# Patient Record
Sex: Male | Born: 1995 | Race: White | Hispanic: No | Marital: Single | Smoking: Never smoker
Health system: Southern US, Community
[De-identification: ages and names within clinical notes are randomized; demographics above are authoritative.]

## PROBLEM LIST (undated history)

## (undated) HISTORY — PX: TYMPANOSTOMY TUBE PLACEMENT: SHX32

---
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
## 2019-12-16 ENCOUNTER — Ambulatory Visit: Payer: BLUE CROSS/BLUE SHIELD | Attending: Internal Medicine

## 2019-12-16 DIAGNOSIS — Z20822 Contact with and (suspected) exposure to covid-19: Secondary | ICD-10-CM

## 2019-12-17 LAB — NOVEL CORONAVIRUS, NAA: SARS-CoV-2, NAA: NOT DETECTED

## 2020-01-21 ENCOUNTER — Ambulatory Visit
Admission: EM | Admit: 2020-01-21 | Discharge: 2020-01-21 | Disposition: A | Discharge status: Home / Self Care | Payer: BC Managed Care – PPO | Attending: Family Medicine | Admitting: Family Medicine

## 2020-01-21 ENCOUNTER — Other Ambulatory Visit: Payer: Self-pay

## 2020-01-21 ENCOUNTER — Encounter: Payer: Self-pay | Admitting: Emergency Medicine

## 2020-01-21 DIAGNOSIS — R0781 Pleurodynia: Secondary | ICD-10-CM | POA: Diagnosis not present

## 2020-01-21 MED ORDER — ALBUTEROL SULFATE HFA 108 (90 BASE) MCG/ACT IN AERS: 1.0000 | INHALATION_SPRAY | Freq: Four times a day (QID) | RESPIRATORY_TRACT | 0 refills | Status: DC | PRN

## 2020-01-21 MED ORDER — MELOXICAM 15 MG PO TABS: 15.0000 mg | ORAL_TABLET | Freq: Every day | ORAL | 0 refills | Status: DC | PRN

## 2020-01-21 NOTE — ED Triage Notes (Signed)
Pt c/o lower chest pain and epigastric pain. Started this morning. He states that it feels hard to take a deep breath. Denies sweats nausea or vomiting.

## 2020-01-21 NOTE — ED Provider Notes (Signed)
[facility]    CSN: [id number] Arrival date & time: 01/21/20  5027      History   Chief Complaint Chief Complaint  Patient presents with  . Chest Pain   HPI  24 year old male presents for evaluation of the above.  Patient reports that he developed chest pain this morning.  Localizes the pain to the lower rib cage bilaterally.  Patient states that he feels like his heart to take deep breath.  No nausea or vomiting.  No diaphoresis.  No central chest pain.  He describes it as a stabbing/sharp pain.  He has a current smoker (marijuana).  No medications or interventions tried.  No relieving factors.  No other complaints.  PMH: Recurrent otitis media  Past Surgical History:  Procedure Laterality Date  . TYMPANOSTOMY TUBE PLACEMENT     Home Medications    Prior to Admission medications   Medication Sig Start Date End Date Taking? Authorizing Provider  albuterol (VENTOLIN HFA) 108 (90 Base) MCG/ACT inhaler Inhale 1-2 puffs into the lungs every 6 (six) hours as needed for wheezing or shortness of breath. 01/21/20   Coral Spikes, DO  meloxicam (MOBIC) 15 MG tablet Take 1 tablet (15 mg total) by mouth daily as needed for pain. 01/21/20   Coral Spikes, DO    Family History Family History  Problem Relation Age of Onset  . Healthy Mother   . Healthy Father     Social History Social History   Tobacco Use  . Smoking status: Never Smoker  . Smokeless tobacco: Never Used  Substance Use Topics  . Alcohol use: No  . Drug use: Yes    Types: Marijuana     Allergies   Penicillins  Review of Systems Review of Systems  Constitutional: Negative.   Cardiovascular: Positive for chest pain.   Physical Exam Triage Vital Signs ED Triage Vitals  Enc Vitals Group     BP 01/21/20 0909 124/74     Pulse Rate 01/21/20 0909 71     Resp 01/21/20 0909 18     Temp 01/21/20 0909 98.1 F (36.7 C)     Temp Source 01/21/20 0909 Oral     SpO2 01/21/20 0909 100 %     Weight  01/21/20 0907 190 lb (86.2 kg)     Height 01/21/20 0907 6' (1.829 m)     Head Circumference --      Peak Flow --      Pain Score 01/21/20 0907 7     Pain Loc --      Pain Edu? --      Excl. in [place]? --    Updated Vital Signs BP 124/74 (BP Location: Left Arm)   Pulse 71   Temp 98.1 F (36.7 C) (Oral)   Resp 18   Ht 6' (1.829 m)   Wt 86.2 kg   SpO2 100%   BMI 25.77 kg/m   Visual Acuity Right Eye Distance:   Left Eye Distance:   Bilateral Distance:    Right Eye Near:   Left Eye Near:    Bilateral Near:     Physical Exam Vitals and nursing note reviewed.  Constitutional:      General: He is not in acute distress.    Appearance: He is well-developed and normal weight. He is not ill-appearing.  HENT:     Head: Normocephalic and atraumatic.  Eyes:     General:        Right eye: No discharge.  Left eye: No discharge.     Conjunctiva/sclera: Conjunctivae normal.  Cardiovascular:     Rate and Rhythm: Normal rate and regular rhythm.     Heart sounds: Normal heart sounds. No murmur.  Pulmonary:     Effort: Pulmonary effort is normal. No respiratory distress.     Breath sounds: Normal breath sounds. No wheezing, rhonchi or rales.  Neurological:     Mental Status: He is alert.  Psychiatric:        Mood and Affect: Mood normal.        Behavior: Behavior normal.    UC Treatments / Results  Labs (all labs ordered are listed, but only abnormal results are displayed) Labs Reviewed - No data to display  EKG Interpretation: Normal sinus rhythm with rate of 74.  Left axis deviation.  Incomplete right bundle branch block.  No ST or T wave changes.  Pulmonary disease pattern noted.  Radiology No results found.  Procedures Procedures (including critical care time)  Medications Ordered in UC Medications - No data to display  Initial Impression / Assessment and Plan / UC Course  I have reviewed the triage vital signs and the nursing notes.  Pertinent labs & imaging  results that were available during my care of the patient were reviewed by me and considered in my medical decision making (see chart for details).    24 year old male presents with pleuritic pain.  Patient reporting lower rib pain bilaterally when he takes a deep breath.  Patient states that it feels like it is hard to take a deep breath.  Exam is unrevealing.  No evidence of cardiac ischemia on EKG.  I suspect that the patient has pleuritic pain.  Advised to quit smoking.  Albuterol as prescribed.  Meloxicam as needed for pain.  Work note given.  Supportive care.  Final Clinical Impressions(s) / UC Diagnoses   Final diagnoses:  Pleuritic pain     Discharge Instructions     Rest.  Medication as prescribed.  Heat.  No smoking.  Take care  Dr. Adriana Simas    ED Prescriptions    Medication Sig Dispense Auth. Provider   albuterol (VENTOLIN HFA) 108 (90 Base) MCG/ACT inhaler Inhale 1-2 puffs into the lungs every 6 (six) hours as needed for wheezing or shortness of breath. 18 g Eivin Mascio G, DO   meloxicam (MOBIC) 15 MG tablet Take 1 tablet (15 mg total) by mouth daily as needed for pain. 30 tablet Tommie Sams, DO     PDMP not reviewed this encounter.   Everlene Other [place], [place] 01/21/20 [phone]

## 2020-01-21 NOTE — Discharge Instructions (Signed)
Rest.  Medication as prescribed.  Heat.  No smoking.  Take care  Dr. Adriana Simas

## 2020-02-06 ENCOUNTER — Other Ambulatory Visit: Payer: Self-pay | Admitting: Family Medicine

## 2020-02-13 ENCOUNTER — Other Ambulatory Visit: Payer: Self-pay | Admitting: Family Medicine

## 2022-06-15 ENCOUNTER — Ambulatory Visit
Admission: EM | Admit: 2022-06-15 | Discharge: 2022-06-15 | Disposition: A | Discharge status: Home / Self Care | Payer: BC Managed Care – PPO | Attending: Emergency Medicine | Admitting: Emergency Medicine

## 2022-06-15 DIAGNOSIS — H00011 Hordeolum externum right upper eyelid: Secondary | ICD-10-CM

## 2022-06-15 MED ORDER — POLYMYXIN B-TRIMETHOPRIM 10000-0.1 UNIT/ML-% OP SOLN: 1.0000 [drp] | OPHTHALMIC | 0 refills | Status: DC

## 2022-06-15 NOTE — Discharge Instructions (Addendum)
Your eye appears to have a stye, these typically resolve within 2 to 3 weeks, inside your packet is more information about this very common condition  We will provide bacterial coverage, place 1 drop of Polytrim into your right eye every 4 hours for the next 7 days  You may hold warm compresses over the affected eye for general comfort and to help with swelling  You may continue use of Visine to help with eye dryness  You may stop use of Benadryl as it will not help  If your symptoms persist past 2 to 3 weeks, you begin to have difficulty seeing, or your stye enlarges you may follow-up with the urgent care or with the ophthalmologist (eye specialist) information is on your paperwork

## 2022-06-15 NOTE — ED Provider Notes (Signed)
[facility]    CSN: [id number] Arrival date & time: 06/15/22  0913      History   Chief Complaint Chief Complaint  Patient presents with   Stye    HPI Ian Butler is a 26 y.o. male.   Patient presents with stye to the right upper eyelid beginning 3 days ago.  Stye is causing dryness and irritation to the eye.  Has attempted use of Benadryl and over-the-counter Visine eyedrops which has been minimally helpful.  Does not wear contacts or glasses.  Denies eye pain, drainage, blurred vision or light sensitivity.    History reviewed. No pertinent past medical history.  There are no problems to display for this patient.   Past Surgical History:  Procedure Laterality Date   TYMPANOSTOMY TUBE PLACEMENT         Home Medications    Prior to Admission medications   Medication Sig Start Date End Date Taking? Authorizing Provider  albuterol (VENTOLIN HFA) 108 (90 Base) MCG/ACT inhaler Inhale 1-2 puffs into the lungs every 6 (six) hours as needed for wheezing or shortness of breath. 01/21/20  Yes Cook, Jayce G, DO  meloxicam (MOBIC) 15 MG tablet Take 1 tablet (15 mg total) by mouth daily as needed for pain. 01/21/20  Yes Tommie Sams, DO    Family History Family History  Problem Relation Age of Onset   Healthy Mother    Healthy Father     Social History Social History   Tobacco Use   Smoking status: Never   Smokeless tobacco: Never  Vaping Use   Vaping Use: Never used  Substance Use Topics   Alcohol use: No   Drug use: Not Currently    Types: Marijuana     Allergies   Penicillins   Review of Systems Review of Systems  Constitutional: Negative.   Eyes:  Negative for photophobia, pain, discharge, redness, itching and visual disturbance.  Respiratory: Negative.    Skin: Negative.   Neurological: Negative.      Physical Exam Triage Vital Signs ED Triage Vitals  Enc Vitals Group     BP 06/15/22 0932 115/82     Pulse Rate 06/15/22 0932 (!)  101     Resp 06/15/22 0932 18     Temp 06/15/22 0932 98.3 F (36.8 C)     Temp Source 06/15/22 0932 Oral     SpO2 06/15/22 0932 99 %     Weight 06/15/22 0929 215 lb (97.5 kg)     Height 06/15/22 0929 6\' 1"  (1.854 m)     Head Circumference --      Peak Flow --      Pain Score 06/15/22 0929 0     Pain Loc --      Pain Edu? --      Excl. in GC? --    No data found.  Updated Vital Signs BP 115/82 (BP Location: Left Arm)   Pulse (!) 101   Temp 98.3 F (36.8 C) (Oral)   Resp 18   Ht 6\' 1"  (1.854 m)   Wt 215 lb (97.5 kg)   SpO2 99%   BMI 28.37 kg/m   Visual Acuity Right Eye Distance:   Left Eye Distance:   Bilateral Distance:    Right Eye Near:   Left Eye Near:    Bilateral Near:     Physical Exam Constitutional:      Appearance: Normal appearance.  HENT:     Head: Normocephalic.  Eyes:  Comments: Hordeolum present to the right upper eyelid with mild erythema and mild to moderate swelling, no abnormalities noted to the sclera, conjunctiva or iris  Pulmonary:     Effort: Pulmonary effort is normal.  Neurological:     Mental Status: He is alert and oriented to person, place, and time. Mental status is at baseline.  Psychiatric:        Mood and Affect: Mood normal.        Behavior: Behavior normal.      UC Treatments / Results  Labs (all labs ordered are listed, but only abnormal results are displayed) Labs Reviewed - No data to display  EKG   Radiology No results found.  Procedures Procedures (including critical care time)  Medications Ordered in UC Medications - No data to display  Initial Impression / Assessment and Plan / UC Course  I have reviewed the triage vital signs and the nursing notes.  Pertinent labs & imaging results that were available during my care of the patient were reviewed by me and considered in my medical decision making (see chart for details).  Hordeolum externum of right upper eyelid  Prescribed Polytrim for bacterial  coverage, recommended warm compresses and continue use of Visine for general comfort, advise discontinuation of Benadryl as it will not be helpful to current condition, discussed etiology of symptoms and possible time for resolution, given walker referral to ophthalmology if symptoms persist or worsen Final Clinical Impressions(s) / UC Diagnoses   Final diagnoses:  None   Discharge Instructions   None    ED Prescriptions   None    PDMP not reviewed this encounter.   Valinda Hoar, NP 06/15/22 1006

## 2022-06-15 NOTE — ED Triage Notes (Signed)
Pt c/o stye on right eye x3days. Pt is having discomfort, swelling, and redness in the right eye.   Pt states that he woke up with a swollen eyelid.  Pt has taken benadryl and eye drops and they did not help.

## 2023-06-11 ENCOUNTER — Ambulatory Visit
Admission: EM | Admit: 2023-06-11 | Discharge: 2023-06-11 | Disposition: A | Discharge status: Home / Self Care | Payer: BC Managed Care – PPO | Attending: Nurse Practitioner | Admitting: Nurse Practitioner

## 2023-06-11 ENCOUNTER — Encounter: Payer: Self-pay | Admitting: *Deleted

## 2023-06-11 ENCOUNTER — Other Ambulatory Visit: Payer: Self-pay

## 2023-06-11 DIAGNOSIS — J069 Acute upper respiratory infection, unspecified: Secondary | ICD-10-CM | POA: Diagnosis not present

## 2023-06-11 DIAGNOSIS — J029 Acute pharyngitis, unspecified: Secondary | ICD-10-CM | POA: Insufficient documentation

## 2023-06-11 LAB — GROUP A STREP BY PCR: Group A Strep by PCR: NOT DETECTED

## 2023-06-11 NOTE — ED Provider Notes (Signed)
[facility]    CSN: [id number] Arrival date & time: 06/11/23  0931      History   Chief Complaint Chief Complaint  Patient presents with   Sore Throat   Fatigue    HPI Ian Butler is a 27 y.o. male  presents for evaluation of URI symptoms for 3 days. Patient reports associated symptoms of sore throat, fatigue, congestion, headache, mild cough. Denies N/V/D, fevers, nuchal rigidity, photosensitivity, ear pain, body aches, shortness of breath. Patient does not have a hx of asthma or smoking. No known sick contacts.  Pt has taken cold medicine OTC for symptoms. Pt has no other concerns at this time.    Sore Throat Associated symptoms include headaches.    History reviewed. No pertinent past medical history.  There are no problems to display for this patient.   Past Surgical History:  Procedure Laterality Date   TYMPANOSTOMY TUBE PLACEMENT         Home Medications    Prior to Admission medications   Medication Sig Start Date End Date Taking? Authorizing Provider  albuterol (VENTOLIN HFA) 108 (90 Base) MCG/ACT inhaler Inhale 1-2 puffs into the lungs every 6 (six) hours as needed for wheezing or shortness of breath. 01/21/20   Tommie Sams, DO  meloxicam (MOBIC) 15 MG tablet Take 1 tablet (15 mg total) by mouth daily as needed for pain. 01/21/20   Tommie Sams, DO  trimethoprim-polymyxin b (POLYTRIM) ophthalmic solution Place 1 drop into the right eye every 4 (four) hours. 06/15/22   Valinda Hoar, NP    Family History Family History  Problem Relation Age of Onset   Healthy Mother    Healthy Father     Social History Social History   Tobacco Use   Smoking status: Never   Smokeless tobacco: Never  Vaping Use   Vaping Use: Never used  Substance Use Topics   Alcohol use: No   Drug use: Not Currently    Types: Marijuana     Allergies   Penicillins   Review of Systems Review of Systems  Constitutional:  Positive for fatigue.  HENT:   Positive for congestion and sore throat.   Respiratory:  Positive for cough.   Neurological:  Positive for headaches.     Physical Exam Triage Vital Signs ED Triage Vitals  Enc Vitals Group     BP 06/11/23 0947 129/84     Pulse Rate 06/11/23 0947 77     Resp 06/11/23 0947 16     Temp 06/11/23 0947 98.1 F (36.7 C)     Temp src --      SpO2 06/11/23 0947 100 %     Weight --      Height --      Head Circumference --      Peak Flow --      Pain Score 06/11/23 0945 6     Pain Loc --      Pain Edu? --      Excl. in GC? --    No data found.  Updated Vital Signs BP 129/84   Pulse 77   Temp 98.1 F (36.7 C)   Resp 16   SpO2 100%   Visual Acuity Right Eye Distance:   Left Eye Distance:   Bilateral Distance:    Right Eye Near:   Left Eye Near:    Bilateral Near:     Physical Exam Vitals and nursing note reviewed.  Constitutional:  General: He is not in acute distress.    Appearance: Normal appearance. He is not ill-appearing or toxic-appearing.  HENT:     Head: Normocephalic and atraumatic.     Right Ear: Tympanic membrane and ear canal normal.     Left Ear: Tympanic membrane and ear canal normal.     Nose: Congestion present.     Mouth/Throat:     Mouth: Mucous membranes are moist.     Pharynx: Posterior oropharyngeal erythema present.  Eyes:     Pupils: Pupils are equal, round, and reactive to light.  Neck:      Comments: Muscular TTP to bilateral trapezius muscles.  No cervical or thoracic spinal tenderness palpation.  Strength 5 out of 5 bilateral upper extremities. Cardiovascular:     Rate and Rhythm: Normal rate and regular rhythm.     Heart sounds: Normal heart sounds.  Pulmonary:     Effort: Pulmonary effort is normal.     Breath sounds: Normal breath sounds.  Musculoskeletal:     Cervical back: Normal range of motion and neck supple. No edema, signs of trauma, rigidity, torticollis or crepitus. Muscular tenderness present. No spinous process  tenderness. Normal range of motion.  Lymphadenopathy:     Cervical: No cervical adenopathy.  Skin:    General: Skin is warm and dry.  Neurological:     General: No focal deficit present.     Mental Status: He is alert and oriented to person, place, and time.  Psychiatric:        Mood and Affect: Mood normal.        Behavior: Behavior normal.      UC Treatments / Results  Labs (all labs ordered are listed, but only abnormal results are displayed) Labs Reviewed  GROUP A STREP BY PCR    EKG   Radiology No results found.  Procedures Procedures (including critical care time)  Medications Ordered in UC Medications - No data to display  Initial Impression / Assessment and Plan / UC Course  I have reviewed the triage vital signs and the nursing notes.  Pertinent labs & imaging results that were available during my care of the patient were reviewed by me and considered in my medical decision making (see chart for details).     Reviewed exam and symptoms with patient.  No red flags.  Discussed viral URI and symptomatic treatment.  Negative strep PCR.  Patient declined COVID PCR.  Discussed musculoskeletal cause of shoulder pain.  OTC ibuprofen or Tylenol as needed.  PCP follow-up if symptoms do not improve.  ER precautions reviewed and patient verbalized understanding. Final Clinical Impressions(s) / UC Diagnoses   Final diagnoses:  Sore throat  Viral upper respiratory tract infection     Discharge Instructions      Your symptoms and exam are consistent for a viral illness. Please treat your symptoms with over the counter cough medication, tylenol or ibuprofen, humidifier, and rest. Viral illnesses can last 7-14 days. Please follow up with your PCP if your symptoms are not improving. Please go to the ER for any worsening symptoms. This includes but is not limited to fever you can not control with tylenol or ibuprofen, you are not able to stay hydrated, you have shortness of  breath or chest pain.  Thank you for choosing [facility] for your healthcare needs. I hope you feel better soon!      ED Prescriptions   None    PDMP not reviewed this encounter.  Radford Pax, NP 06/11/23 1045

## 2023-06-11 NOTE — Discharge Instructions (Signed)
Your symptoms and exam are consistent for a viral illness. Please treat your symptoms with over the counter cough medication, tylenol or ibuprofen, humidifier, and rest. Viral illnesses can last 7-14 days. Please follow up with your PCP if your symptoms are not improving. Please go to the ER for any worsening symptoms. This includes but is not limited to fever you can not control with tylenol or ibuprofen, you are not able to stay hydrated, you have shortness of breath or chest pain.  Thank you for choosing [facility] for your healthcare needs. I hope you feel better soon!  

## 2023-06-11 NOTE — ED Triage Notes (Signed)
Pt reports fatigue and sore throat since Sunday.Pt also has a HA at base of neck into Shoulders.

## 2024-09-01 ENCOUNTER — Encounter: Payer: Self-pay | Admitting: Emergency Medicine

## 2024-09-01 ENCOUNTER — Ambulatory Visit
Admission: EM | Admit: 2024-09-01 | Discharge: 2024-09-01 | Disposition: A | Discharge status: Home / Self Care | Attending: Physician Assistant | Admitting: Physician Assistant

## 2024-09-01 DIAGNOSIS — H00014 Hordeolum externum left upper eyelid: Secondary | ICD-10-CM | POA: Diagnosis not present

## 2024-09-01 MED ORDER — ERYTHROMYCIN 5 MG/GM OP OINT: TOPICAL_OINTMENT | OPHTHALMIC | 0 refills | Status: DC

## 2024-09-01 NOTE — ED Triage Notes (Signed)
 Pt presents with a stye on his left upper eye lid x 2 days.

## 2024-09-01 NOTE — Discharge Instructions (Addendum)
-  Frequent application of warm compresses and use ointment. Should improve and resolve within a week

## 2024-09-01 NOTE — ED Provider Notes (Signed)
 [facility]    CSN: [id number] Arrival date & time: 09/01/24  9161      History   Chief Complaint Chief Complaint  Patient presents with   Stye    HPI Ian Butler is a 28 y.o. male presenting for pain, swelling and redness of the left upper eyelid x 2 days.  Denies any drainage from the eyelid or eye.  No eye redness, vision changes, fever, dizziness or vomiting.  Has been applying warm compresses and antimicrobial eyewash.  HPI  History reviewed. No pertinent past medical history.  There are no active problems to display for this patient.   Past Surgical History:  Procedure Laterality Date   TYMPANOSTOMY TUBE PLACEMENT         Home Medications    Prior to Admission medications   Medication Sig Start Date End Date Taking? Authorizing Provider  erythromycin  ophthalmic ointment Place a 1/2 inch ribbon of ointment into the upper L eyelid q6h x 1 week 09/01/24  Yes Arvis Jolan NOVAK, PA-C    Family History Family History  Problem Relation Age of Onset   Healthy Mother    Healthy Father     Social History Social History   Tobacco Use   Smoking status: Never   Smokeless tobacco: Never  Vaping Use   Vaping status: Never Used  Substance Use Topics   Alcohol use: No   Drug use: Not Currently    Types: Marijuana     Allergies   Amoxicillin and Penicillins   Review of Systems Review of Systems  Constitutional:  Negative for fatigue and fever.  HENT:  Positive for facial swelling.   Eyes:  Positive for pain. Negative for photophobia, discharge, redness, itching and visual disturbance.  Neurological:  Negative for headaches.     Physical Exam Triage Vital Signs ED Triage Vitals  Encounter Vitals Group     BP 09/01/24 0847 126/85     Girls Systolic BP Percentile --      Girls Diastolic BP Percentile --      Boys Systolic BP Percentile --      Boys Diastolic BP Percentile --      Pulse Rate 09/01/24 0847 95     Resp 09/01/24 0847 16      Temp 09/01/24 0847 98.1 F (36.7 C)     Temp Source 09/01/24 0847 Oral     SpO2 09/01/24 0847 97 %     Weight --      Height --      Head Circumference --      Peak Flow --      Pain Score 09/01/24 0846 1     Pain Loc --      Pain Education --      Exclude from Growth Chart --    No data found.  Updated Vital Signs BP 126/85 (BP Location: Right Arm)   Pulse 95   Temp 98.1 F (36.7 C) (Oral)   Resp 16   SpO2 97%    Physical Exam Vitals and nursing note reviewed.  Constitutional:      General: He is not in acute distress.    Appearance: Normal appearance. He is well-developed. He is not ill-appearing.  HENT:     Head: Normocephalic and atraumatic.  Eyes:     General: Vision grossly intact. No scleral icterus.       Left eye: Hordeolum (left upper eyelid) present.    Conjunctiva/sclera: Conjunctivae normal.  Right eye: Right conjunctiva is not injected.     Left eye: Left conjunctiva is not injected.  Cardiovascular:     Rate and Rhythm: Normal rate and regular rhythm.  Pulmonary:     Effort: Pulmonary effort is normal. No respiratory distress.  Musculoskeletal:     Cervical back: Neck supple.  Skin:    General: Skin is warm and dry.     Capillary Refill: Capillary refill takes less than 2 seconds.  Neurological:     General: No focal deficit present.     Mental Status: He is alert. Mental status is at baseline.     Motor: No weakness.     Gait: Gait normal.  Psychiatric:        Mood and Affect: Mood normal.      UC Treatments / Results  Labs (all labs ordered are listed, but only abnormal results are displayed) Labs Reviewed - No data to display  EKG   Radiology No results found.  Procedures Procedures (including critical care time)  Medications Ordered in UC Medications - No data to display  Initial Impression / Assessment and Plan / UC Course  I have reviewed the triage vital signs and the nursing notes.  Pertinent labs & imaging  results that were available during my care of the patient were reviewed by me and considered in my medical decision making (see chart for details).   28 year old male presents for pain, swelling and erythema of left upper eyelid x 2 days.  Has been using warm compresses.  Hordeolum/stye of left upper eyelid.  Reviewed continue warm compresses.  Sent erythromycin  ophthalmic ointment.  Reviewed return precautions.   Final Clinical Impressions(s) / UC Diagnoses   Final diagnoses:  Hordeolum externum of left upper eyelid     Discharge Instructions      -Frequent application of warm compresses and use ointment. Should improve and resolve within a week   ED Prescriptions     Medication Sig Dispense Auth. Provider   erythromycin  ophthalmic ointment Place a 1/2 inch ribbon of ointment into the upper L eyelid q6h x 1 week 3.5 g Arvis Jolan NOVAK, PA-C      PDMP not reviewed this encounter.   Arvis Jolan NOVAK, PA-C 09/01/24 [phone]

## 2024-10-21 ENCOUNTER — Ambulatory Visit
Admission: EM | Admit: 2024-10-21 | Discharge: 2024-10-21 | Disposition: A | Discharge status: Home / Self Care | Attending: Family Medicine | Admitting: Family Medicine

## 2024-10-21 DIAGNOSIS — J029 Acute pharyngitis, unspecified: Secondary | ICD-10-CM | POA: Diagnosis not present

## 2024-10-21 DIAGNOSIS — J069 Acute upper respiratory infection, unspecified: Secondary | ICD-10-CM | POA: Diagnosis not present

## 2024-10-21 LAB — POCT RAPID STREP A (OFFICE): Rapid Strep A Screen: NEGATIVE

## 2024-10-21 LAB — POC COVID19/FLU A&B COMBO
Covid Antigen, POC: NEGATIVE
Influenza A Antigen, POC: NEGATIVE
Influenza B Antigen, POC: NEGATIVE

## 2024-10-21 MED ORDER — PROMETHAZINE-DM 6.25-15 MG/5ML PO SYRP: 5.0000 mL | ORAL_SOLUTION | Freq: Four times a day (QID) | ORAL | 0 refills | Status: AC | PRN

## 2024-10-21 MED ORDER — IPRATROPIUM BROMIDE 0.06 % NA SOLN: 2.0000 | Freq: Four times a day (QID) | NASAL | 0 refills | Status: AC

## 2024-10-21 NOTE — Discharge Instructions (Addendum)
 Your strep, influenza and COVID are negative. I will send your throat culture for verification that you don't have strep. If positive, someone will call you to start antibiotics.  You most likely have a viral respiratory infection that will gradually improve over the next 7-10 days. Cough may last up to 3 weeks. If your were prescribed medication, stop by the pharmacy to pick them up.  You can take Tylenol  and/or Ibuprofen as needed for fever reduction and pain relief.    For cough: honey 1/2 to 1 teaspoon (you can dilute the honey in water or another fluid). Stop at the pharmacy to pick up your prescription cough medication. You can use a humidifier for chest congestion and cough.  If you don't have a humidifier, you can sit in the bathroom with the hot shower running.      For sore throat: try warm salt water gargles, Mucinex sore throat cough drops or cepacol lozenges, throat spray, warm tea or water with lemon/honey, popsicles or ice, or OTC cold relief medicine for throat discomfort. You can also purchase chloraseptic spray at the pharmacy or dollar store.    For congestion: take a daily anti-histamine like Zyrtec, Claritin, and a oral decongestant, such as pseudoephedrine.  You can also use Flonase 1-2 sprays in each nostril daily. Pick up the prescription nasal spray from the pharmacy.    It is important to stay hydrated: drink plenty of fluids (water, gatorade/powerade/pedialyte, juices, or teas) to keep your throat moisturized and help further relieve irritation/discomfort.    Return or go to the Emergency Department if symptoms worsen or do not improve in the next few days

## 2024-10-21 NOTE — ED Provider Notes (Signed)
 [facility]    CSN: [id number] Arrival date & time: 10/21/24  9180      History   Chief Complaint Chief Complaint  Patient presents with   Sore Throat   Cough   Generalized Body Aches    HPI Ian Butler is a 28 y.o. male.   HPI  History obtained from the patient. Ian Butler presents for 3 days of sore throat, cough, body aches, nasal congestion and rhinorrhea.  Denies fever, vomiting, diarrhea, nausea and headache.  Sore throat has improved.  Tried DayQuil and NyQuil.   His mom and dad are sick with similar sx.       History reviewed. No pertinent past medical history.  There are no active problems to display for this patient.   Past Surgical History:  Procedure Laterality Date   TYMPANOSTOMY TUBE PLACEMENT         Home Medications    Prior to Admission medications   Medication Sig Start Date End Date Taking? Authorizing Provider  ipratropium (ATROVENT) 0.06 % nasal spray Place 2 sprays into both nostrils 4 (four) times daily. 10/21/24  Yes Tyreik Delahoussaye, DO  promethazine-dextromethorphan (PROMETHAZINE-DM) 6.25-15 MG/5ML syrup Take 5 mLs by mouth 4 (four) times daily as needed. 10/21/24  Yes Kriste Berth, DO    Family History Family History  Problem Relation Age of Onset   Healthy Mother    Healthy Father     Social History Social History   Tobacco Use   Smoking status: Never   Smokeless tobacco: Never  Vaping Use   Vaping status: Never Used  Substance Use Topics   Alcohol use: No   Drug use: Not Currently    Types: Marijuana     Allergies   Amoxicillin and Penicillins   Review of Systems Review of Systems: negative unless otherwise stated in HPI.      Physical Exam Triage Vital Signs ED Triage Vitals  Encounter Vitals Group     BP 10/21/24 0900 (!) 145/82     Girls Systolic BP Percentile --      Girls Diastolic BP Percentile --      Boys Systolic BP Percentile --      Boys Diastolic BP Percentile --       Pulse Rate 10/21/24 0900 95     Resp 10/21/24 0900 18     Temp 10/21/24 0900 98.6 F (37 C)     Temp Source 10/21/24 0900 Oral     SpO2 10/21/24 0900 95 %     Weight 10/21/24 0858 233 lb 11.2 oz (106 kg)     Height --      Head Circumference --      Peak Flow --      Pain Score 10/21/24 0859 6     Pain Loc --      Pain Education --      Exclude from Growth Chart --    No data found.  Updated Vital Signs BP (!) 145/82 (BP Location: Right Arm)   Pulse 95   Temp 98.6 F (37 C) (Oral)   Resp 18   Wt 106 kg   SpO2 95%   BMI 30.83 kg/m   Visual Acuity Right Eye Distance:   Left Eye Distance:   Bilateral Distance:    Right Eye Near:   Left Eye Near:    Bilateral Near:     Physical Exam GEN:     alert, non-toxic appearing male in no distress  HENT:  mucus membranes moist, oropharyngeal without lesions or erythema, no tonsillar hypertrophy or exudates, clear nasal discharge EYES:   pupils equal and reactive, no scleral injection or discharge NECK:  normal ROM, +lymphadenopathy, no meningismus   RESP:  no increased work of breathing, clear to auscultation bilaterally CVS:   regular rate and rhythm Skin:   warm and dry, no rash on visible skin    UC Treatments / Results  Labs (all labs ordered are listed, but only abnormal results are displayed) Labs Reviewed  POCT RAPID STREP A (OFFICE) - Normal  POC COVID19/FLU A&B COMBO - Normal  CULTURE, GROUP A STREP [facility])    EKG   Radiology No results found.   Procedures Procedures (including critical care time)  Medications Ordered in UC Medications - No data to display  Initial Impression / Assessment and Plan / UC Course  I have reviewed the triage vital signs and the nursing notes.  Pertinent labs & imaging results that were available during my care of the patient were reviewed by me and considered in my medical decision making (see chart for details).       Pt is a 28 y.o. male who presents for 3 days  of respiratory symptoms. Ian Butler is afebrile here without recent antipyretics. Satting well on room air. Overall pt is non-toxic appearing, well hydrated, without respiratory distress. Pulmonary exam is unremarkable.  POC COVID and influenza panel obtained and was negative. POC strep is negative.  Strep throat culture sent for verification.   Suspect viral respiratory illness. Discussed symptomatic treatment. Promethazine DM for cough. Atrovent nasal spray for nasal congestion.  Explained lack of efficacy of antibiotics in viral disease.  Typical duration of symptoms discussed. Work note provided.   Return and ED precautions given and voiced understanding. Discussed MDM, treatment plan and plan for follow-up with patient who agrees with plan.     Final Clinical Impressions(s) / UC Diagnoses   Final diagnoses:  Viral URI with cough  Sore throat     Discharge Instructions      Your strep, influenza and COVID are negative. I will send your throat culture for verification that you don't have strep. If positive, someone will call you to start antibiotics.  You most likely have a viral respiratory infection that will gradually improve over the next 7-10 days. Cough may last up to 3 weeks. If your were prescribed medication, stop by the pharmacy to pick them up.  You can take Tylenol  and/or Ibuprofen as needed for fever reduction and pain relief.    For cough: honey 1/2 to 1 teaspoon (you can dilute the honey in water or another fluid). Stop at the pharmacy to pick up your prescription cough medication. You can use a humidifier for chest congestion and cough.  If you don't have a humidifier, you can sit in the bathroom with the hot shower running.      For sore throat: try warm salt water gargles, Mucinex sore throat cough drops or cepacol lozenges, throat spray, warm tea or water with lemon/honey, popsicles or ice, or OTC cold relief medicine for throat discomfort. You can also purchase  chloraseptic spray at the pharmacy or dollar store.    For congestion: take a daily anti-histamine like Zyrtec, Claritin, and a oral decongestant, such as pseudoephedrine.  You can also use Flonase 1-2 sprays in each nostril daily. Pick up the prescription nasal spray from the pharmacy.    It is important to stay hydrated: drink plenty  of fluids (water, gatorade/powerade/pedialyte, juices, or teas) to keep your throat moisturized and help further relieve irritation/discomfort.    Return or go to the Emergency Department if symptoms worsen or do not improve in the next few days      ED Prescriptions     Medication Sig Dispense Auth. Provider   promethazine-dextromethorphan (PROMETHAZINE-DM) 6.25-15 MG/5ML syrup Take 5 mLs by mouth 4 (four) times daily as needed. 118 mL Kazue Cerro, DO   ipratropium (ATROVENT) 0.06 % nasal spray Place 2 sprays into both nostrils 4 (four) times daily. 15 mL Sundy Houchins, DO      PDMP not reviewed this encounter.   Remie Mathison, DO 10/21/24 0945

## 2024-10-21 NOTE — ED Triage Notes (Signed)
 Pt c/o sore throat,bodyaches,cough & congestion x3 days. Has tried OTC meds w/o relief.

## 2024-10-23 ENCOUNTER — Ambulatory Visit (HOSPITAL_COMMUNITY): Payer: Self-pay

## 2024-10-23 LAB — CULTURE, GROUP A STREP (THRC)
